# Patient Record
Sex: Male | Born: 1995 | Race: White | Hispanic: No | Marital: Single | Smoking: Never smoker
Health system: Southern US, Community
[De-identification: ages and names within clinical notes are randomized; demographics above are authoritative.]

## PROBLEM LIST (undated history)

## (undated) DIAGNOSIS — F329 Major depressive disorder, single episode, unspecified: Secondary | ICD-10-CM

## (undated) DIAGNOSIS — F32A Depression, unspecified: Secondary | ICD-10-CM

## (undated) DIAGNOSIS — F419 Anxiety disorder, unspecified: Secondary | ICD-10-CM

---
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
## 2015-01-06 ENCOUNTER — Encounter: Payer: Self-pay | Admitting: Family Medicine

## 2015-01-06 ENCOUNTER — Ambulatory Visit
Admission: EM | Admit: 2015-01-06 | Discharge: 2015-01-06 | Disposition: A | Discharge status: Home / Self Care | Payer: BC Managed Care – PPO | Attending: Family Medicine | Admitting: Family Medicine

## 2015-01-06 ENCOUNTER — Ambulatory Visit: Payer: BC Managed Care – PPO

## 2015-01-06 DIAGNOSIS — M545 Low back pain, unspecified: Secondary | ICD-10-CM

## 2015-01-06 DIAGNOSIS — F419 Anxiety disorder, unspecified: Secondary | ICD-10-CM | POA: Diagnosis not present

## 2015-01-06 DIAGNOSIS — M542 Cervicalgia: Secondary | ICD-10-CM | POA: Diagnosis not present

## 2015-01-06 DIAGNOSIS — M79676 Pain in unspecified toe(s): Secondary | ICD-10-CM | POA: Diagnosis present

## 2015-01-06 DIAGNOSIS — F329 Major depressive disorder, single episode, unspecified: Secondary | ICD-10-CM | POA: Diagnosis not present

## 2015-01-06 DIAGNOSIS — S90121A Contusion of right lesser toe(s) without damage to nail, initial encounter: Secondary | ICD-10-CM

## 2015-01-06 HISTORY — DX: Anxiety disorder, unspecified: F41.9

## 2015-01-06 HISTORY — DX: Major depressive disorder, single episode, unspecified: F32.9

## 2015-01-06 HISTORY — DX: Depression, unspecified: F32.A

## 2015-01-06 MED ORDER — CYCLOBENZAPRINE HCL 10 MG PO TABS: 10.0000 mg | ORAL_TABLET | Freq: Two times a day (BID) | ORAL | Status: AC | PRN
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
## 2015-01-06 NOTE — Discharge Instructions (Signed)
Back Exercises °Back exercises help treat and prevent back injuries. The goal of back exercises is to increase the strength of your abdominal and back muscles and the flexibility of your back. These exercises should be started when you no longer have back pain. Back exercises include: °· Pelvic Tilt. Lie on your back with your knees bent. Tilt your pelvis until the lower part of your back is against the floor. Hold this position 5 to 10 sec and repeat 5 to 10 times. °· Knee to Chest. Pull first 1 knee up against your chest and hold for 20 to 30 seconds, repeat this with the other knee, and then both knees. This may be done with the other leg straight or bent, whichever feels better. °· Sit-Ups or Curl-Ups. Bend your knees 90 degrees. Start with tilting your pelvis, and do a partial, slow sit-up, lifting your trunk only 30 to 45 degrees off the floor. Take at least 2 to 3 seconds for each sit-up. Do not do sit-ups with your knees out straight. If partial sit-ups are difficult, simply do the above but with only tightening your abdominal muscles and holding it as directed. °· Hip-Lift. Lie on your back with your knees flexed 90 degrees. Push down with your feet and shoulders as you raise your hips a couple inches off the floor; hold for 10 seconds, repeat 5 to 10 times. °· Back arches. Lie on your stomach, propping yourself up on bent elbows. Slowly press on your hands, causing an arch in your low back. Repeat 3 to 5 times. Any initial stiffness and discomfort should lessen with repetition over time. °· Shoulder-Lifts. Lie face down with arms beside your body. Keep hips and torso pressed to floor as you slowly lift your head and shoulders off the floor. °Do not overdo your exercises, especially in the beginning. Exercises may cause you some mild back discomfort which lasts for a few minutes; however, if the pain is more severe, or lasts for more than 15 minutes, do not continue exercises until you see your caregiver.  Improvement with exercise therapy for back problems is slow.  °See your caregivers for assistance with developing a proper back exercise program. °Document Released: 09/09/2004 Document Revised: 10/25/2011 Document Reviewed: 06/03/2011 °Exit[employer]® Patient Information ©2015 [employer]. This information is not intended to replace advice given to you by your health care provider. Make sure you discuss any questions you have with your health care provider. ° °Back Pain, Adult °Low back pain is very common. About 1 in 5 people have back pain. The cause of low back pain is rarely dangerous. The pain often gets better over time. About half of people with a sudden onset of back pain feel better in just 2 weeks. About 8 in 10 people feel better by 6 weeks.  °CAUSES °Some common causes of back pain include: °· Strain of the muscles or ligaments supporting the spine. °· Wear and tear (degeneration) of the spinal discs. °· Arthritis. °· Direct injury to the back. °DIAGNOSIS °Most of the time, the direct cause of low back pain is not known. However, back pain can be treated effectively even when the exact cause of the pain is unknown. Answering your caregiver's questions about your overall health and symptoms is one of the most accurate ways to make sure the cause of your pain is not dangerous. If your caregiver needs more information, he or she may order lab work or imaging tests (X-rays or MRIs). However, even if imaging tests show changes in your   back, this usually does not require surgery. HOME CARE INSTRUCTIONS For many people, back pain returns.Since low back pain is rarely dangerous, it is often a condition that people can learn to [facility] their own.   Remain active. It is stressful on the back to sit or stand in one place. Do not sit, drive, or stand in one place for more than 30 minutes at a time. Take short walks on level surfaces as soon as pain allows.Try to increase the length of time you walk each  day.  Do not stay in bed.Resting more than 1 or 2 days can delay your recovery.  Do not avoid exercise or work.Your body is made to move.It is not dangerous to be active, even though your back may hurt.Your back will likely heal faster if you return to being active before your pain is gone.  Pay attention to your body when you bend and lift. Many people have less discomfortwhen lifting if they bend their knees, keep the load close to their bodies,and avoid twisting. Often, the most comfortable positions are those that put less stress on your recovering back.  Find a comfortable position to sleep. Use a firm mattress and lie on your side with your knees slightly bent. If you lie on your back, put a pillow under your knees.  Only take over-the-counter or prescription medicines as directed by your caregiver. Over-the-counter medicines to reduce pain and inflammation are often the most helpful.Your caregiver may prescribe muscle relaxant drugs.These medicines help dull your pain so you can more quickly return to your normal activities and healthy exercise.  Put ice on the injured area.  Put ice in a plastic bag.  Place a towel between your skin and the bag.  Leave the ice on for 15-20 minutes, 03-04 times a day for the first 2 to 3 days. After that, ice and heat may be alternated to reduce pain and spasms.  Ask your caregiver about trying back exercises and gentle massage. This may be of some benefit.  Avoid feeling anxious or stressed.Stress increases muscle tension and can worsen back pain.It is important to recognize when you are anxious or stressed and learn ways to manage it.Exercise is a great option. SEEK MEDICAL CARE IF:  You have pain that is not relieved with rest or medicine.  You have pain that does not improve in 1 week.  You have new symptoms.  You are generally not feeling well. SEEK IMMEDIATE MEDICAL CARE IF:   You have pain that radiates from your back into  your legs.  You develop new bowel or bladder control problems.  You have unusual weakness or numbness in your arms or legs.  You develop nausea or vomiting.  You develop abdominal pain.  You feel faint. Document Released: 08/02/2005 Document Revised: 02/01/2012 Document Reviewed: 12/04/2013 [facility] Patient Information 2015 [place], [place]. This information is not intended to replace advice given to you by your health care provider. Make sure you discuss any questions you have with your health care provider.  Contusion A contusion is a deep bruise. Contusions are the result of an injury that caused bleeding under the skin. The contusion may turn blue, purple, or yellow. Minor injuries will give you a painless contusion, but more severe contusions may stay painful and swollen for a few weeks.  CAUSES  A contusion is usually caused by a blow, trauma, or direct force to an area of the body. SYMPTOMS   Swelling and redness of the injured area.  Bruising of the injured area.  Tenderness and soreness of the injured area.  Pain. DIAGNOSIS  The diagnosis can be made by taking a history and physical exam. An X-ray, CT scan, or MRI may be needed to determine if there were any associated injuries, such as fractures. TREATMENT  Specific treatment will depend on what area of the body was injured. In general, the best treatment for a contusion is resting, icing, elevating, and applying cold compresses to the injured area. Over-the-counter medicines may also be recommended for pain control. Ask your caregiver what the best treatment is for your contusion. HOME CARE INSTRUCTIONS   Put ice on the injured area.  Put ice in a plastic bag.  Place a towel between your skin and the bag.  Leave the ice on for 15-20 minutes, 3-4 times a day, or as directed by your health care provider.  Only take over-the-counter or prescription medicines for pain, discomfort, or fever as directed by your caregiver.  Your caregiver may recommend avoiding anti-inflammatory medicines (aspirin, ibuprofen, and naproxen) for 48 hours because these medicines may increase bruising.  Rest the injured area.  If possible, elevate the injured area to reduce swelling. SEEK IMMEDIATE MEDICAL CARE IF:   You have increased bruising or swelling.  You have pain that is getting worse.  Your swelling or pain is not relieved with medicines. MAKE SURE YOU:   Understand these instructions.  Will watch your condition.  Will get help right away if you are not doing well or get worse. Document Released: 05/12/2005 Document Revised: 08/07/2013 Document Reviewed: 06/07/2011 [facility] Patient Information 2015 [place], [place]. This information is not intended to replace advice given to you by your health care provider. Make sure you discuss any questions you have with your health care provider.  Motor Vehicle Collision It is common to have multiple bruises and sore muscles after a motor vehicle collision (MVC). These tend to feel worse for the first 24 hours. You may have the most stiffness and soreness over the first several hours. You may also feel worse when you wake up the first morning after your collision. After this point, you will usually begin to improve with each day. The speed of improvement often depends on the severity of the collision, the number of injuries, and the location and nature of these injuries. HOME CARE INSTRUCTIONS  Put ice on the injured area.  Put ice in a plastic bag.  Place a towel between your skin and the bag.  Leave the ice on for 15-20 minutes, 3-4 times a day, or as directed by your health care provider.  Drink enough fluids to keep your urine clear or pale yellow. Do not drink alcohol.  Take a warm shower or bath once or twice a day. This will increase blood flow to sore muscles.  You may return to activities as directed by your caregiver. Be careful when lifting, as this may  aggravate neck or back pain.  Only take over-the-counter or prescription medicines for pain, discomfort, or fever as directed by your caregiver. Do not use aspirin. This may increase bruising and bleeding. SEEK IMMEDIATE MEDICAL CARE IF:  You have numbness, tingling, or weakness in the arms or legs.  You develop severe headaches not relieved with medicine.  You have severe neck pain, especially tenderness in the middle of the back of your neck.  You have changes in bowel or bladder control.  There is increasing pain in any area of the body.  You  have shortness of breath, light-headedness, dizziness, or fainting.  You have chest pain.  You feel sick to your stomach (nauseous), throw up (vomit), or sweat.  You have increasing abdominal discomfort.  There is blood in your urine, stool, or vomit.  You have pain in your shoulder (shoulder strap areas).  You feel your symptoms are getting worse. MAKE SURE YOU:  Understand these instructions.  Will watch your condition.  Will get help right away if you are not doing well or get worse. Document Released: 08/02/2005 Document Revised: 12/17/2013 Document Reviewed: 12/30/2010 [facility] Patient Information 2015 [place], [place]. This information is not intended to replace advice given to you by your health care provider. Make sure you discuss any questions you have with your health care provider.

## 2015-01-11 ENCOUNTER — Ambulatory Visit
Admission: EM | Admit: 2015-01-11 | Discharge: 2015-01-11 | Disposition: A | Discharge status: Home / Self Care | Payer: BC Managed Care – PPO | Attending: Family Medicine | Admitting: Family Medicine

## 2015-01-11 ENCOUNTER — Encounter: Payer: Self-pay | Admitting: Emergency Medicine

## 2015-01-11 ENCOUNTER — Ambulatory Visit: Payer: BC Managed Care – PPO

## 2015-01-11 DIAGNOSIS — S060X0A Concussion without loss of consciousness, initial encounter: Secondary | ICD-10-CM | POA: Diagnosis not present

## 2015-01-11 DIAGNOSIS — Y9362 Activity, american flag or touch football: Secondary | ICD-10-CM | POA: Insufficient documentation

## 2015-01-11 DIAGNOSIS — X58XXXA Exposure to other specified factors, initial encounter: Secondary | ICD-10-CM | POA: Diagnosis not present

## 2015-01-11 DIAGNOSIS — R42 Dizziness and giddiness: Secondary | ICD-10-CM | POA: Insufficient documentation

## 2015-01-11 DIAGNOSIS — R51 Headache: Secondary | ICD-10-CM | POA: Insufficient documentation

## 2015-01-11 MED ORDER — KETOROLAC TROMETHAMINE 60 MG/2ML IM SOLN
60.0000 mg | Freq: Once | INTRAMUSCULAR | Status: AC
  Administered 2015-01-11: 60 mg via INTRAMUSCULAR
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
## 2016-01-07 IMAGING — CT CT CERVICAL SPINE W/O CM
4 series · 16 of 33 positions shown, 19 images · non-contrast
Comparison: None.

CLINICAL DATA: Dizziness and headache, football injury

EXAM:
CT HEAD WITHOUT CONTRAST
CT CERVICAL SPINE WITHOUT CONTRAST
TECHNIQUE: Multidetector CT imaging of the head and cervical spine was
performed following the standard protocol without intravenous
contrast. Multiplanar CT image reconstructions of the cervical spine
were also generated.

[Series 3: c spine soft · axial · 0.47mm/px · z∈[-151,-79]mm · 3 of 110 slices shown]
[im 19/110  soft-tissue]
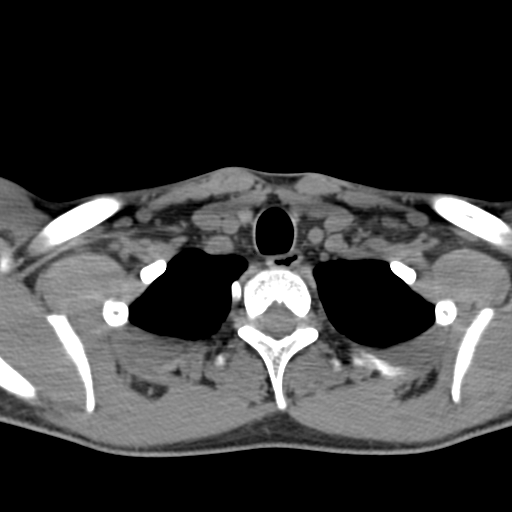
[im 37/110  soft-tissue]
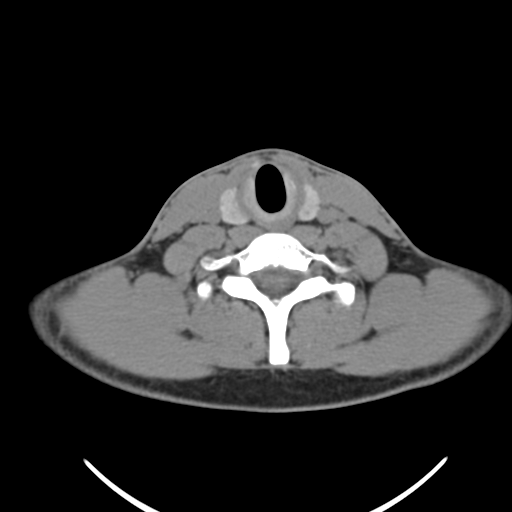
[im 55/110  soft-tissue]
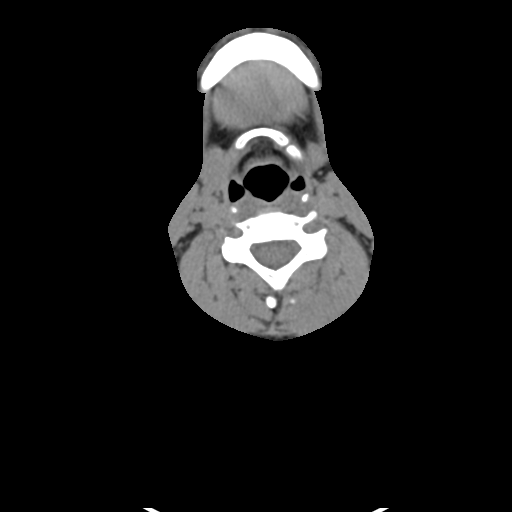

[Series 602: coronal · coronal · 0.47mm/px · 3 of 100 slices shown]
[im 20/100  bone]
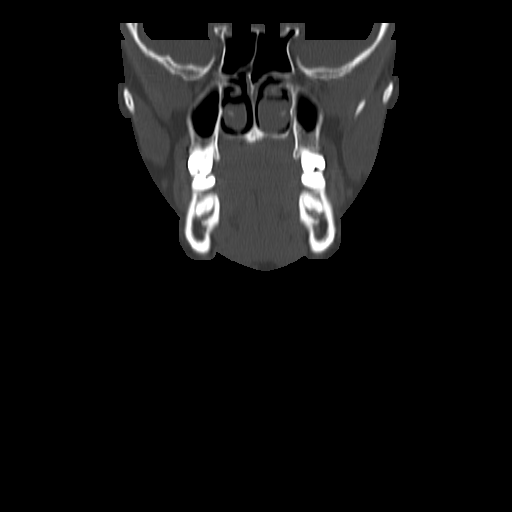
[im 40/100  bone]
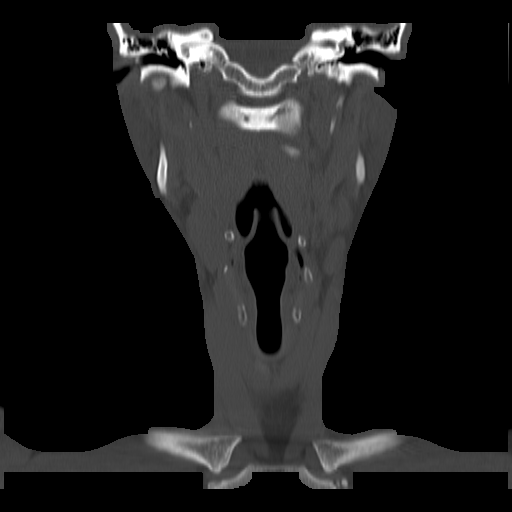
[im 60/100  bone]
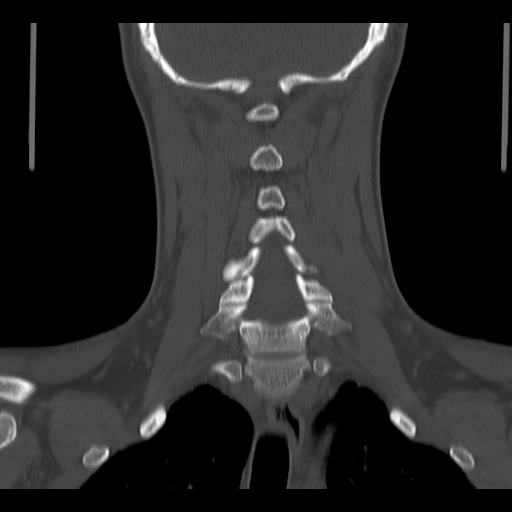

[Series 603: sagittal · sagittal · 0.47mm/px · 5 of 76 slices shown, 6 images]
[im 26/76  bone]
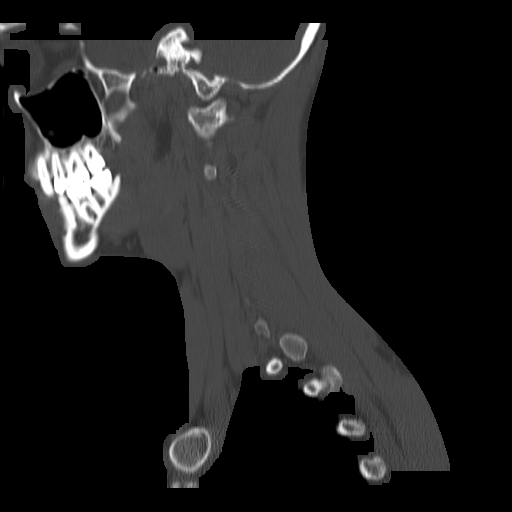
[im 32/76  bone]
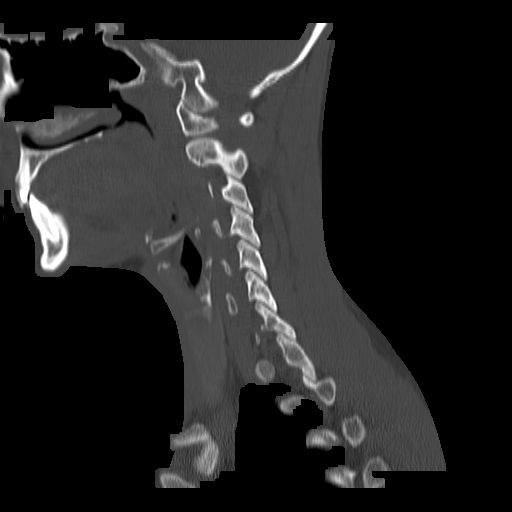
[im 38/76  soft-tissue]
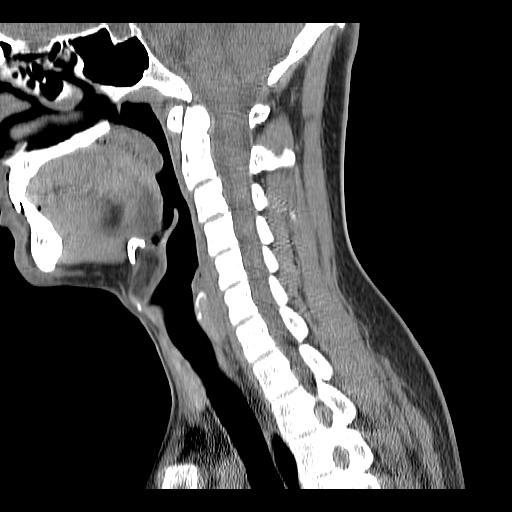
[im 38/76  bone]
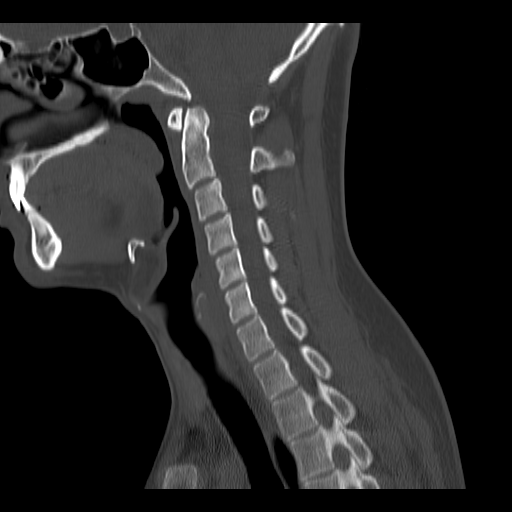
[im 44/76  bone]
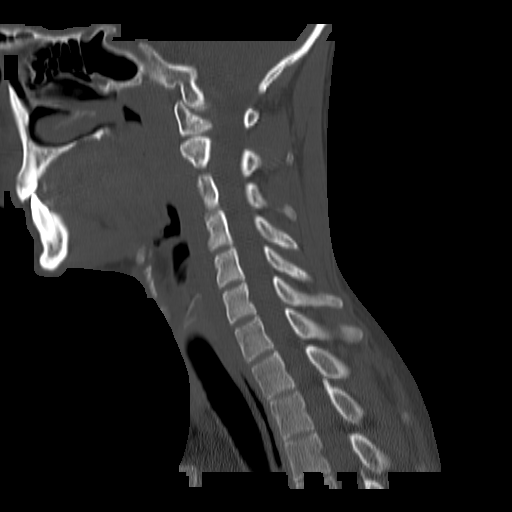
[im 51/76  bone]
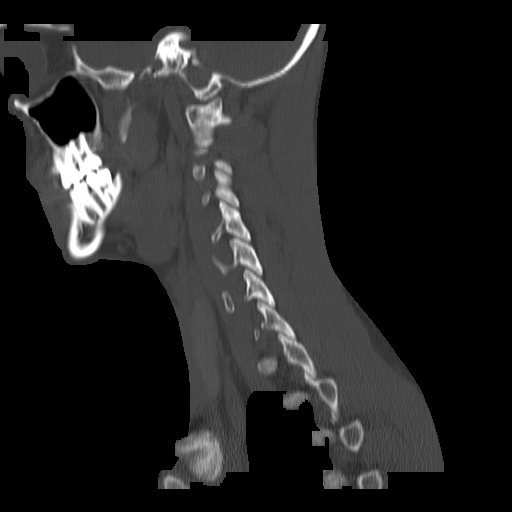

[Series 604: axial · axial · 0.47mm/px · z∈[-103,+29]mm · 5 of 106 slices shown, 7 images]
[im 18/106  soft-tissue]
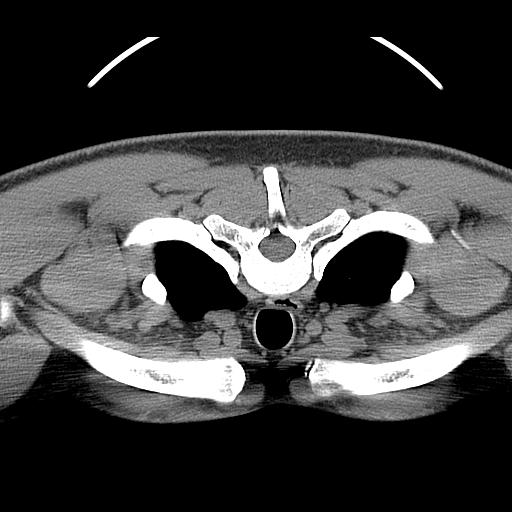
[im 18/106  bone]
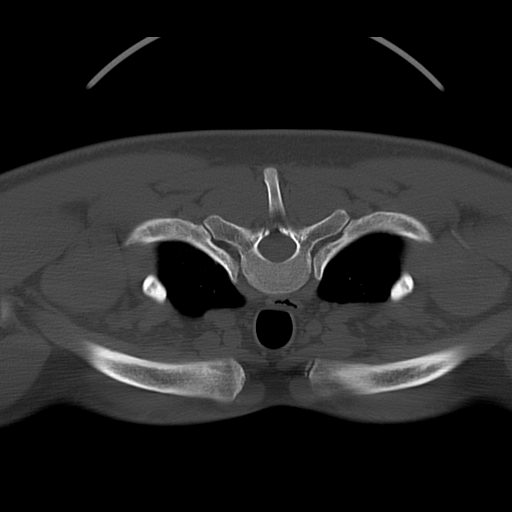
[im 36/106  bone]
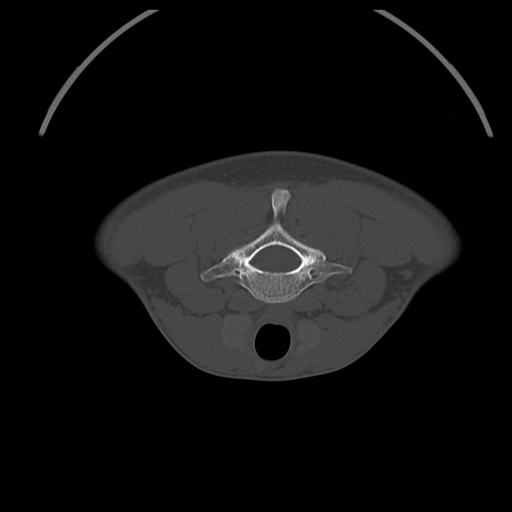
[im 53/106  bone]
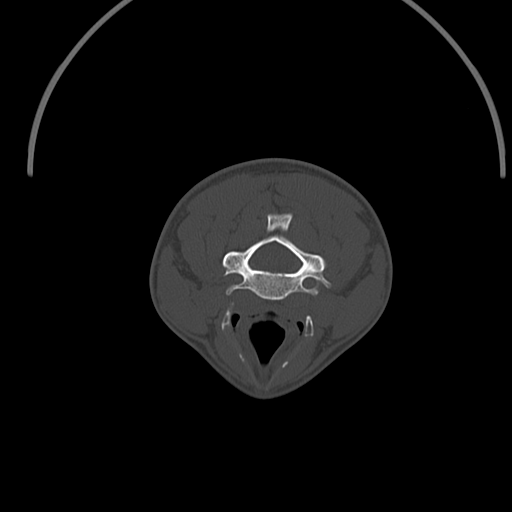
[im 71/106  bone]
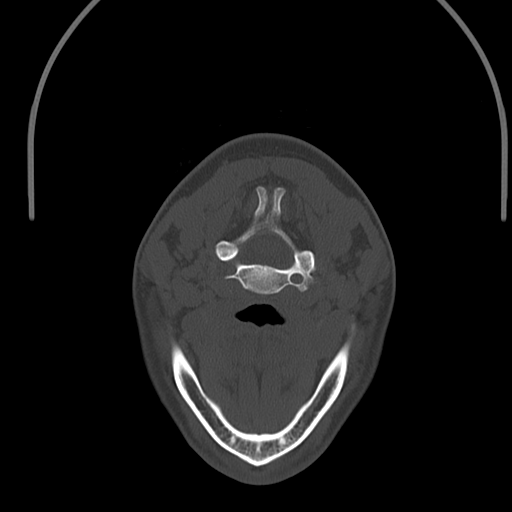
[im 88/106  soft-tissue]
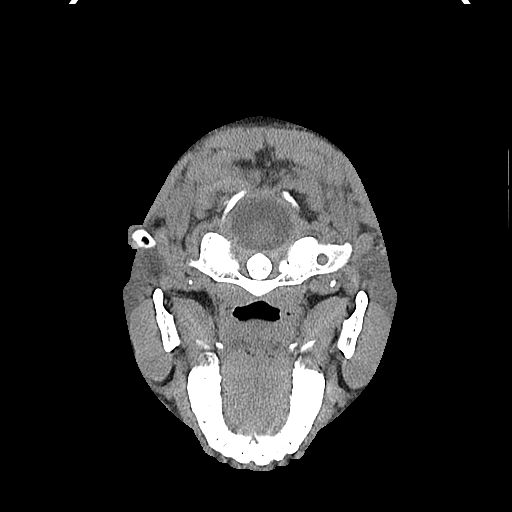
[im 88/106  bone]
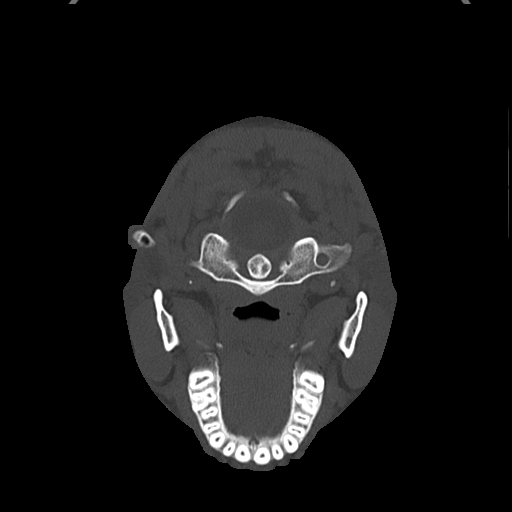

[16 of 33 positions shown; findings below may reference images not displayed]

FINDINGS: CT HEAD FINDINGS

No mass lesion. No midline shift. No acute hemorrhage or hematoma.
No extra-axial fluid collections. No evidence of acute infarction.
No skull fracture.

CT CERVICAL SPINE FINDINGS

No fracture. Normal alignment. No soft tissue abnormalities. Lung
apices clear.
IMPRESSION: Negative CT of the head and cervical spine.
